# Patient Record
Sex: Female | Born: 1990 | Race: Black or African American | Hispanic: No | Marital: Single | Smoking: Never smoker
Health system: Southern US, Community
[De-identification: ages and names within clinical notes are randomized; demographics above are authoritative.]

## PROBLEM LIST (undated history)

## (undated) DIAGNOSIS — J45909 Unspecified asthma, uncomplicated: Secondary | ICD-10-CM

## (undated) DIAGNOSIS — N200 Calculus of kidney: Secondary | ICD-10-CM

---

## 2018-07-15 ENCOUNTER — Emergency Department (HOSPITAL_COMMUNITY)
Admission: EM | Admit: 2018-07-15 | Discharge: 2018-07-15 | Disposition: A | Discharge status: Home / Self Care | Payer: Self-pay | Attending: Emergency Medicine | Admitting: Emergency Medicine

## 2018-07-15 ENCOUNTER — Encounter (HOSPITAL_COMMUNITY): Payer: Self-pay

## 2018-07-15 ENCOUNTER — Other Ambulatory Visit: Payer: Self-pay

## 2018-07-15 ENCOUNTER — Emergency Department (HOSPITAL_COMMUNITY): Payer: Self-pay

## 2018-07-15 DIAGNOSIS — J069 Acute upper respiratory infection, unspecified: Secondary | ICD-10-CM | POA: Insufficient documentation

## 2018-07-15 LAB — I-STAT BETA HCG BLOOD, ED (MC, WL, AP ONLY): I-stat hCG, quantitative: <5 m[IU]/mL (ref <5)

## 2018-07-15 NOTE — ED Provider Notes (Signed)
[facility] Provider Note   CSN: [id number] Arrival date & time: 07/15/18  0846     History   Chief Complaint Chief Complaint  Patient presents with  . Shortness of Breath    HPI Nichole Richardson is a 28 y.o. female.  The history is provided by the patient.  Cough  Cough characteristics:  Non-productive Sputum characteristics:  Nondescript Severity:  Mild Onset quality:  Gradual Timing:  Intermittent Progression:  Waxing and waning Chronicity:  New Smoker: no   Relieved by:  Cough suppressants Worsened by:  Nothing Associated symptoms: chest pain and shortness of breath   Associated symptoms: no chills, no ear pain, no fever, no myalgias, no rash, no sinus congestion, no sore throat and no wheezing   Risk factors: no recent infection and no recent travel     History reviewed. No pertinent past medical history.  There are no active problems to display for this patient.     OB History   No obstetric history on file.      Home Medications    Prior to Admission medications   Medication Sig Start Date End Date Taking? Authorizing Provider  Pseudoephedrine-APAP-DM (DAYQUIL MULTI-SYMPTOM COLD/FLU PO) Take 2 tablets by mouth as needed (cold symptons).    Yes [provider]    Family History History reviewed. No pertinent family history.  Social History Social History   Tobacco Use  . Smoking status: Not on file  Substance Use Topics  . Alcohol use: Not on file  . Drug use: Not on file     Allergies   Patient has no known allergies.   Review of Systems Review of Systems  Constitutional: Negative for chills and fever.  HENT: Negative for ear pain and sore throat.   Eyes: Negative for pain and visual disturbance.  Respiratory: Positive for cough and shortness of breath. Negative for wheezing.   Cardiovascular: Positive for chest pain. Negative for palpitations.  Gastrointestinal: Negative for  abdominal pain and vomiting.  Genitourinary: Negative for dysuria and hematuria.  Musculoskeletal: Negative for arthralgias, back pain and myalgias.  Skin: Negative for color change and rash.  Neurological: Negative for seizures and syncope.  All other systems reviewed and are negative.    Physical Exam Updated Vital Signs  Vitals wnl  Physical Exam Vitals signs and nursing note reviewed.  Constitutional:      General: She is not in acute distress.    Appearance: She is well-developed.  HENT:     Head: Normocephalic and atraumatic.     Mouth/Throat:     Mouth: Mucous membranes are moist.     Pharynx: No pharyngeal swelling or oropharyngeal exudate.  Eyes:     Conjunctiva/sclera: Conjunctivae normal.     Pupils: Pupils are equal, round, and reactive to light.  Neck:     Musculoskeletal: Neck supple.  Cardiovascular:     Rate and Rhythm: Normal rate and regular rhythm.     Pulses: Normal pulses.     Heart sounds: Normal heart sounds. No murmur.  Pulmonary:     Effort: Pulmonary effort is normal. No respiratory distress.     Breath sounds: Normal breath sounds. No decreased breath sounds, wheezing or rhonchi.  Abdominal:     Palpations: Abdomen is soft.     Tenderness: There is no abdominal tenderness.  Musculoskeletal:     Right lower leg: No edema.     Left lower leg: No edema.  Skin:  General: Skin is warm and dry.     Capillary Refill: Capillary refill takes less than 2 seconds.  Neurological:     Mental Status: She is alert.  Psychiatric:        Mood and Affect: Mood normal.      ED Treatments / Results  Labs (all labs ordered are listed, but only abnormal results are displayed) Labs Reviewed  I-STAT BETA HCG BLOOD, ED ([facility], [facility], [facility] ONLY)    EKG EKG Interpretation  Date/Time:  Sunday July 15 2018 08:56:54 EST Ventricular Rate:  88 PR Interval:    QRS Duration: 91 QT Interval:  355 QTC Calculation: 430 R Axis:   63 Text Interpretation:  Sinus  rhythm Confirmed by Virgina Norfolk [phone]) on 07/15/2018 9:07:27 AM   Radiology Dg Chest 2 View  Result Date: 07/15/2018 CLINICAL DATA:  Shortness of breath. EXAM: CHEST - 2 VIEW COMPARISON:  None. FINDINGS: The heart size and mediastinal contours are within normal limits. Both lungs are clear. No pneumothorax or pleural effusion is noted. The visualized skeletal structures are unremarkable. IMPRESSION: No active cardiopulmonary disease. Electronically Signed   By: Lupita Raider, M.D.   On: 07/15/2018 10:01    Procedures Procedures (including critical care time)  Medications Ordered in ED Medications - No data to display   Initial Impression / Assessment and Plan / ED Course  I have reviewed the triage vital signs and the nursing notes.  Pertinent labs & imaging results that were available during my care of the patient were reviewed by me and considered in my medical decision making (see chart for details).     Nichole Richardson is a 28 year old female with no significant medical history presents to the ED with cough, intermittent chest pain, shortness of breath.  Patient with unremarkable vitals.  Patient with sick contact with the flu.  She has had dry cough.  When she coughs she feels some chest pain and shortness of breath.  However asymptomatic when she is not coughing.  Has tried some over-the-counter medications with some relief.  Did not get a flu shot this year.  Denies any body aches, fever.  Patient does not have any DVT or PE risk factors and doubt PE.  No significant cardiac history in the family.  No cardiac risk factors.  EKG shows sinus rhythm.  No ischemic changes.  Doubt cardiac process.  Chest x-ray showed no signs of pneumonia, pneumothorax, pleural effusion.  Pregnancy test was negative.  Overall suspect mild viral process.  Discharged from the ED in good condition and given return precautions. Given PCP follow up.  This chart was dictated using voice recognition software.   Despite best efforts to proofread,  errors can occur which can change the documentation meaning.  Final Clinical Impressions(s) / ED Diagnoses   Final diagnoses:  Upper respiratory tract infection, unspecified type    ED Discharge Orders    None       Virgina Norfolk, DO 07/15/18 1009

## 2018-07-15 NOTE — ED Triage Notes (Signed)
Pt with c/o SOB x 2 days; pt states that its getting worse and she has discomfort in chest. Pt states that immediately family members have had flu recently

## 2018-07-15 NOTE — Discharge Instructions (Addendum)
There was no sign of pneumonia on your chest x-ray today.  Continue hydration at home.  Tylenol, Motrin.  Your pregnancy test was negative.

## 2018-10-23 ENCOUNTER — Emergency Department (HOSPITAL_COMMUNITY): Payer: Medicaid - Out of State

## 2018-10-23 ENCOUNTER — Other Ambulatory Visit: Payer: Self-pay

## 2018-10-23 ENCOUNTER — Emergency Department (HOSPITAL_COMMUNITY)
Admission: EM | Admit: 2018-10-23 | Discharge: 2018-10-23 | Disposition: A | Discharge status: Home / Self Care | Payer: Medicaid - Out of State | Attending: Emergency Medicine | Admitting: Emergency Medicine

## 2018-10-23 ENCOUNTER — Encounter (HOSPITAL_COMMUNITY): Payer: Self-pay | Admitting: Emergency Medicine

## 2018-10-23 DIAGNOSIS — R062 Wheezing: Secondary | ICD-10-CM | POA: Insufficient documentation

## 2018-10-23 DIAGNOSIS — R0602 Shortness of breath: Secondary | ICD-10-CM | POA: Diagnosis present

## 2018-10-23 DIAGNOSIS — J9801 Acute bronchospasm: Secondary | ICD-10-CM | POA: Insufficient documentation

## 2018-10-23 HISTORY — DX: Calculus of kidney: N20.0

## 2018-10-23 LAB — BASIC METABOLIC PANEL
Anion gap: 9 (ref 5–15)
BUN: 10 mg/dL (ref 6–20)
CO2: 22 mmol/L (ref 22–32)
Calcium: 8.9 mg/dL (ref 8.9–10.3)
Chloride: 108 mmol/L (ref 98–111)
Creatinine, Ser: 1.09 mg/dL — ABNORMAL HIGH (ref 0.44–1.00)
GFR calc Af Amer: >60 mL/min (ref >60)
GFR calc non Af Amer: >60 mL/min (ref >60)
Glucose, Bld: 92 mg/dL (ref 70–99)
Potassium: 3.6 mmol/L (ref 3.5–5.1)
Sodium: 139 mmol/L (ref 135–145)

## 2018-10-23 LAB — CBC WITH DIFFERENTIAL/PLATELET
Abs Immature Granulocytes: 0.02 10*3/uL (ref 0.00–0.07)
Basophils Absolute: 0 10*3/uL (ref 0.0–0.1)
Basophils Relative: 0 %
Eosinophils Absolute: 0.6 10*3/uL — ABNORMAL HIGH (ref 0.0–0.5)
Eosinophils Relative: 7 %
HCT: 40.7 % (ref 36.0–46.0)
Hemoglobin: 12.6 g/dL (ref 12.0–15.0)
Immature Granulocytes: 0 %
Lymphocytes Relative: 41 %
Lymphs Abs: 3.5 10*3/uL (ref 0.7–4.0)
MCH: 26.3 pg (ref 26.0–34.0)
MCHC: 31 g/dL (ref 30.0–36.0)
MCV: 85 fL (ref 80.0–100.0)
Monocytes Absolute: 0.5 10*3/uL (ref 0.1–1.0)
Monocytes Relative: 6 %
Neutro Abs: 3.9 10*3/uL (ref 1.7–7.7)
Neutrophils Relative %: 46 %
Platelets: 237 10*3/uL (ref 150–400)
RBC: 4.79 MIL/uL (ref 3.87–5.11)
RDW: 12.4 % (ref 11.5–15.5)
WBC: 8.6 10*3/uL (ref 4.0–10.5)
nRBC: 0 % (ref 0.0–0.2)

## 2018-10-23 MED ORDER — ALBUTEROL SULFATE HFA 108 (90 BASE) MCG/ACT IN AERS
2.0000 | INHALATION_SPRAY | Freq: Once | RESPIRATORY_TRACT | Status: AC
  Administered 2018-10-23: 04:00:00 2 via RESPIRATORY_TRACT
  Filled 2018-10-23: qty 6.7

## 2018-10-23 MED ORDER — METHYLPREDNISOLONE SODIUM SUCC 125 MG IJ SOLR
125.0000 mg | Freq: Once | INTRAMUSCULAR | Status: AC
  Administered 2018-10-23: 03:00:00 125 mg via INTRAVENOUS
  Filled 2018-10-23: qty 2

## 2018-10-23 MED ORDER — PREDNISONE 20 MG PO TABS: ORAL_TABLET | ORAL | 0 refills | Status: DC

## 2018-10-23 MED ORDER — IPRATROPIUM BROMIDE 0.02 % IN SOLN
0.5000 mg | Freq: Once | RESPIRATORY_TRACT | Status: AC
  Administered 2018-10-23: 0.5 mg via RESPIRATORY_TRACT
  Filled 2018-10-23: qty 2.5

## 2018-10-23 MED ORDER — ALBUTEROL SULFATE (2.5 MG/3ML) 0.083% IN NEBU
5.0000 mg | INHALATION_SOLUTION | Freq: Once | RESPIRATORY_TRACT | Status: AC
  Administered 2018-10-23: 03:00:00 5 mg via RESPIRATORY_TRACT
  Filled 2018-10-23: qty 6

## 2018-10-23 MED ORDER — ALBUTEROL SULFATE (2.5 MG/3ML) 0.083% IN NEBU
5.0000 mg | INHALATION_SOLUTION | Freq: Once | RESPIRATORY_TRACT | Status: AC
  Administered 2018-10-23: 5 mg via RESPIRATORY_TRACT
  Filled 2018-10-23: qty 6

## 2018-10-23 MED ORDER — MAGNESIUM SULFATE 2 GM/50ML IV SOLN
2.0000 g | Freq: Once | INTRAVENOUS | Status: AC
  Administered 2018-10-23: 2 g via INTRAVENOUS
  Filled 2018-10-23: qty 50

## 2018-10-23 NOTE — ED Notes (Signed)
Patient verbalizes understanding of discharge instructions. Opportunity for questioning and answers were provided. Armband removed by staff, pt discharged from ED ambulatory.   

## 2018-10-23 NOTE — ED Provider Notes (Signed)
[facility] EMERGENCY DEPARTMENT Provider Note   CSN: [id number] Arrival date & time: 10/23/18  4098    History   Chief Complaint Chief Complaint  Patient presents with  . Shortness of Breath    HPI Nichole Richardson is a 28 y.o. female.     The history is provided by the patient and medical records.  Shortness of Breath  Associated symptoms: cough and wheezing      28 y.o. F with history of seasonal and pet allergies, presenting to the ED for SOB.  States she has been sneezing and having itchy eyes lately but has been taking claritin 24H with moderate control.  States tonight about an hour ago symptoms got acutely worse.  She has been wheezing with chest tightness.  She has no history of asthma.  She does have a dog at home which she has had for a few months but felt like it was ok since she was taking allergy medication.  Also reports this is her first allergy season in [place] as she recently moved here from [place], [place].  No fevers, chills, cough, or COVID exposures.  States she has been staying home aside from grocery shopping when absolutely necessary.  No past medical history on file.  There are no active problems to display for this patient.    OB History   No obstetric history on file.      Home Medications    Prior to Admission medications   Medication Sig Start Date End Date Taking? Authorizing Provider  Pseudoephedrine-APAP-DM (DAYQUIL MULTI-SYMPTOM COLD/FLU PO) Take 2 tablets by mouth as needed (cold symptons).     [provider]    Family History No family history on file.  Social History Social History   Tobacco Use  . Smoking status: Not on file  Substance Use Topics  . Alcohol use: Not on file  . Drug use: Not on file     Allergies   Patient has no known allergies.   Review of Systems Review of Systems  Respiratory: Positive for cough, shortness of breath and wheezing.   All other systems reviewed and are  negative.    Physical Exam Updated Vital Signs Temp 98.3 F (36.8 C) (Oral)   Ht  (1.6 m)   Wt 59 kg   BMI 23.03 kg/m   Physical Exam Vitals signs and nursing note reviewed.  Constitutional:      Appearance: She is well-developed.  HENT:     Head: Normocephalic and atraumatic.  Eyes:     Conjunctiva/sclera: Conjunctivae normal.     Pupils: Pupils are equal, round, and reactive to light.     Comments: Slight puffiness around the eyes  Neck:     Musculoskeletal: Normal range of motion.  Cardiovascular:     Rate and Rhythm: Normal rate and regular rhythm.     Heart sounds: Normal heart sounds.  Pulmonary:     Effort: Pulmonary effort is normal.     Breath sounds: Wheezing present. No rhonchi.     Comments: Audible wheezing from bedside, speaking in short truncated sentences; accessory muscle use noted, O2 sats 95% Abdominal:     General: Bowel sounds are normal.     Palpations: Abdomen is soft.  Musculoskeletal: Normal range of motion.  Skin:    General: Skin is warm and dry.  Neurological:     Mental Status: She is alert and oriented to person, place, and time.      ED  Treatments / Results  Labs (all labs ordered are listed, but only abnormal results are displayed) Labs Reviewed  CBC WITH DIFFERENTIAL/PLATELET - Abnormal; Notable for the following components:      Result Value   Eosinophils Absolute 0.6 (*)    All other components within normal limits  BASIC METABOLIC PANEL - Abnormal; Notable for the following components:   Creatinine, Ser 1.09 (*)    All other components within normal limits    EKG EKG Interpretation  Date/Time:  Tuesday October 23 2018 02:38:23 EDT Ventricular Rate:  105 PR Interval:    QRS Duration: 94 QT Interval:  293 QTC Calculation: 388 R Axis:   82 Text Interpretation:  Sinus tachycardia Borderline T abnormalities, diffuse leads Artifact in lead(s) II III aVR aVF Otherwise no significant change Confirmed by Drema Pry  [phone]) on 10/23/2018 3:26:31 AM   Radiology Dg Chest Port 1 View  Result Date: 10/23/2018 CLINICAL DATA:  Wheezing, shortness of breath EXAM: PORTABLE CHEST 1 VIEW COMPARISON:  07/15/2018 FINDINGS: Heart and mediastinal contours are within normal limits. No focal opacities or effusions. No acute bony abnormality. IMPRESSION: No active disease. Electronically Signed   By: Charlett Nose M.D.   On: 10/23/2018 02:51    Procedures Procedures (including critical care time)  Medications Ordered in ED Medications  albuterol (PROVENTIL) (2.5 MG/3ML) 0.083% nebulizer solution 5 mg (5 mg Nebulization Given 10/23/18 0240)  ipratropium (ATROVENT) nebulizer solution 0.5 mg (0.5 mg Nebulization Given 10/23/18 0240)  magnesium sulfate IVPB 2 g 50 mL (0 g Intravenous Stopped 10/23/18 0345)  methylPREDNISolone sodium succinate (SOLU-MEDROL) 125 mg/2 mL injection 125 mg (125 mg Intravenous Given 10/23/18 0242)  albuterol (PROVENTIL) (2.5 MG/3ML) 0.083% nebulizer solution 5 mg (5 mg Nebulization Given 10/23/18 0345)  albuterol (VENTOLIN HFA) 108 (90 Base) MCG/ACT inhaler 2 puff (2 puffs Inhalation Given 10/23/18 0345)     Initial Impression / Assessment and Plan / ED Course  I have reviewed the triage vital signs and the nursing notes.  Pertinent labs & imaging results that were available during my care of the patient were reviewed by me and considered in my medical decision making (see chart for details).  28 y.o. F here with SOB.  Has bad seasonal allergies, also has dog with known pet allergies. No history of asthma. She is afebrile, non-toxic but does appear somewhat labored with accessory muscle use.  Wheezing audible from bedside.  She is able to speak in short sentences.  No fevers, chills, cough, or other URI symptoms.  No sick contacts or known COVID exposures.  Plan for screening labs, EKG, CXR.  Given albuterol treatment, solu-medrol, magnesium.    3:15 AM After initial meds, patient states she feels  better.  She is now talking on phone with significant other.  O2 sats up to 100% now.  She still has some expiratory wheezes.  Labs reassuring.  CXR clear.  Will give additional neb and can likely discharge with symptomatic care.  Rx prednisone, continue albuterol PRN.  Can use PRN benadryl as needed, continue allergy medications.  Follow-up as an OP.  Return here for any new/acute changes.  Final Clinical Impressions(s) / ED Diagnoses   Final diagnoses:  Bronchospasm  Wheezing    ED Discharge Orders         Ordered    predniSONE (DELTASONE) 20 MG tablet     10/23/18 0346           Garlon Hatchet, PA-C 10/23/18 [phone]  Nira Connardama, Pedro Eduardo, MD 10/23/18 [phone]

## 2018-10-23 NOTE — Discharge Instructions (Signed)
Take the prescribed medication as directed.  Continue your allergy medication.  You can add benadryl if needed every 6-8 hours. Follow-up with your primary care doctor. Return to the ED for new or worsening symptoms.

## 2018-10-23 NOTE — ED Triage Notes (Signed)
C/O of SOB starting this evening. Pt very wheezy on arrival weith increased work of breathing noted. No hx of Asthma. Reports allergy to dogs and pt just got a new a few months ago.

## 2018-11-30 ENCOUNTER — Other Ambulatory Visit: Payer: Self-pay

## 2018-11-30 ENCOUNTER — Emergency Department (HOSPITAL_COMMUNITY)
Admission: EM | Admit: 2018-11-30 | Discharge: 2018-11-30 | Disposition: A | Discharge status: Home / Self Care | Payer: Medicaid - Out of State | Attending: Emergency Medicine | Admitting: Emergency Medicine

## 2018-11-30 ENCOUNTER — Encounter (HOSPITAL_COMMUNITY): Payer: Self-pay | Admitting: Emergency Medicine

## 2018-11-30 DIAGNOSIS — F17210 Nicotine dependence, cigarettes, uncomplicated: Secondary | ICD-10-CM | POA: Insufficient documentation

## 2018-11-30 DIAGNOSIS — R05 Cough: Secondary | ICD-10-CM | POA: Insufficient documentation

## 2018-11-30 DIAGNOSIS — F1290 Cannabis use, unspecified, uncomplicated: Secondary | ICD-10-CM | POA: Insufficient documentation

## 2018-11-30 DIAGNOSIS — R0602 Shortness of breath: Secondary | ICD-10-CM | POA: Diagnosis present

## 2018-11-30 DIAGNOSIS — R062 Wheezing: Secondary | ICD-10-CM | POA: Diagnosis not present

## 2018-11-30 DIAGNOSIS — R1110 Vomiting, unspecified: Secondary | ICD-10-CM | POA: Diagnosis not present

## 2018-11-30 MED ORDER — ACETAMINOPHEN 325 MG PO TABS
650.0000 mg | ORAL_TABLET | Freq: Once | ORAL | Status: AC
  Administered 2018-11-30: 650 mg via ORAL
  Filled 2018-11-30: qty 2

## 2018-11-30 MED ORDER — ALBUTEROL SULFATE HFA 108 (90 BASE) MCG/ACT IN AERS
4.0000 | INHALATION_SPRAY | Freq: Once | RESPIRATORY_TRACT | Status: AC
  Administered 2018-11-30: 4 via RESPIRATORY_TRACT
  Filled 2018-11-30: qty 6.7

## 2018-11-30 MED ORDER — PREDNISONE 20 MG PO TABS
60.0000 mg | ORAL_TABLET | Freq: Once | ORAL | Status: AC
  Administered 2018-11-30: 60 mg via ORAL
  Filled 2018-11-30: qty 3

## 2018-11-30 MED ORDER — PREDNISONE 10 MG PO TABS: 40.0000 mg | ORAL_TABLET | Freq: Every day | ORAL | 0 refills | Status: AC

## 2018-11-30 NOTE — ED Triage Notes (Signed)
Pt. Stated, I started being  SOB a moth ago, given an inhaler and now Im out and Im still having SOB .

## 2018-11-30 NOTE — Discharge Instructions (Addendum)
Take prednisone as prescribed beginning tomorrow.  You received the first dose in the emergency department today.  You can use the good Rx app to help make medications more affordable.  Use the albuterol inhaler 1 to 2 puffs every 4-6 hours as needed for shortness of breath.  You can take over-the-counter allergy medicine such as Zyrtec or Allegra for your symptoms as well.  He can also use Robitussin, Mucinex, or other over-the-counter cough and cold medicines.  Follow-up with a primary care physician for reevaluation of your symptoms.  I have attached resources for follow-up in the area.  I have also given you the information for  and wellness and primary care at [facility].  Call them tomorrow and tell them that you were referred from the emergency department.   Return to the emergency department if any concerning signs or symptoms develop such as worsening shortness of breath, persistent chest pains, leg swelling, high fevers, persistent vomiting, or passing out.

## 2018-11-30 NOTE — ED Provider Notes (Addendum)
[facility] EMERGENCY DEPARTMENT Provider Note   CSN: [id number] Arrival date & time: 11/30/18  1640    History   Chief Complaint Chief Complaint  Patient presents with  . Shortness of Breath    HPI Nichole Richardson is a 28 y.o. female with history of seasonal allergies presents for evaluation of acute onset, waxing and waning shortness of breath, cough, and wheezing for over 1 month.  She was seen and evaluated for the symptoms on 10/23/2018 and discharged with a course of prednisone and an albuterol inhaler.  She reports that she was unable to fill the prednisone prescription due to financial constraints.  She does report that the albuterol was significantly helpful but she ran out of it a few days ago.  She states that she was using it twice daily.  She continues to take her allergy medicines as well with some improvement.  She notes dry cough, with wheezes, chest tightness and soreness.  Denies fever, leg swelling, recent travel or surgeries, hemoptysis, prior history of DVT or PE, or hormone replacement therapy.  She has had some posttussive emesis there is resulting from the persistent cough.  Also used to take Mucinex with improvement in her symptoms but stopped taking this recently.  She is requesting a refill of albuterol inhaler.  She smokes marijuana occasionally, denies cigarette smoking or recreational drug use otherwise.  No known sick contacts.     The history is provided by the patient.    Past Medical History:  Diagnosis Date  . Renal stones     There are no active problems to display for this patient.   History reviewed. No pertinent surgical history.   OB History   No obstetric history on file.      Home Medications    Prior to Admission medications   Medication Sig Start Date End Date Taking? Authorizing Provider  predniSONE (DELTASONE) 10 MG tablet Take 4 tablets (40 mg total) by mouth daily for 5 days. 11/30/18 12/05/18  Aniqua Briere A, PA-C   Pseudoephedrine-APAP-DM (DAYQUIL MULTI-SYMPTOM COLD/FLU PO) Take 2 tablets by mouth as needed (cold symptons).     [provider]    Family History No family history on file.  Social History Social History   Tobacco Use  . Smoking status: Current Every Day Smoker  . Smokeless tobacco: Current User  Substance Use Topics  . Alcohol use: Yes    Comment: occas  . Drug use: Yes    Types: Marijuana     Allergies   Patient has no known allergies.   Review of Systems Review of Systems  Constitutional: Negative for chills and fever.  Respiratory: Positive for cough, chest tightness and shortness of breath.   Cardiovascular: Negative for chest pain and leg swelling.  Gastrointestinal: Positive for vomiting (post-tussive). Negative for abdominal pain.  All other systems reviewed and are negative.    Physical Exam Updated Vital Signs BP (!) 110/95 (BP Location: Right Arm)   Pulse 85   Temp (!) 97.4 F (36.3 C) (Oral)   Resp 16   Ht  (1.6 m)   Wt 63.5 kg   LMP 10/08/2018   SpO2 99%   BMI 24.80 kg/m   Physical Exam Vitals signs and nursing note reviewed.  Constitutional:      General: She is not in acute distress.    Appearance: She is well-developed.     Comments: Resting comfortably in chair  HENT:     Head:  Normocephalic and atraumatic.  Eyes:     General:        Right eye: No discharge.        Left eye: No discharge.     Conjunctiva/sclera: Conjunctivae normal.  Neck:     Vascular: No JVD.     Trachea: No tracheal deviation.  Cardiovascular:     Rate and Rhythm: Normal rate and regular rhythm.     Comments: Homans sign absent bilaterally Pulmonary:     Effort: Pulmonary effort is normal.     Comments: Speaking in full sentences without difficulty, though she does have audible wheezes.  On auscultation of the lungs diffuse expiratory wheezes are noted.  SPO2 saturations 98% on room air. Abdominal:     General: Bowel sounds are normal. There  is no distension.     Palpations: Abdomen is soft. There is no mass.     Tenderness: There is no abdominal tenderness. There is no guarding.  Musculoskeletal:     Right lower leg: She exhibits no tenderness. No edema.     Left lower leg: She exhibits no tenderness. No edema.  Skin:    General: Skin is warm and dry.     Findings: No erythema.  Neurological:     Mental Status: She is alert.  Psychiatric:        Behavior: Behavior normal.      ED Treatments / Results  Labs (all labs ordered are listed, but only abnormal results are displayed) Labs Reviewed - No data to display  EKG None  Radiology No results found.  Procedures Procedures (including critical care time)  Medications Ordered in ED Medications  albuterol (VENTOLIN HFA) 108 (90 Base) MCG/ACT inhaler 4 puff (4 puffs Inhalation Given 11/30/18 1826)  predniSONE (DELTASONE) tablet 60 mg (60 mg Oral Given 11/30/18 1826)  acetaminophen (TYLENOL) tablet 650 mg (650 mg Oral Given 11/30/18 1826)     Initial Impression / Assessment and Plan / ED Course  I have reviewed the triage vital signs and the nursing notes.  Pertinent labs & imaging results that were available during my care of the patient were reviewed by me and considered in my medical decision making (see chart for details).        Patient with recurrence of shortness of breath, cough, wheezing.  She is afebrile, initially mildly tachycardic with complete resolution on subsequent reevaluation's. She has obvious wheezing on examination but is speaking in full sentences with stable SPO2 saturations.  No fever.  Abdomen is soft and nontender, doubt acute surgical abdominal pathology.  She does not wish to have a chest x-ray today and does not think that she has pneumonia.  She states "I just want a breathing treatment and an albuterol inhaler refill".  I explained to her that given the COVID-19 pandemic, we are not administering breathing treatments due to  aerosolization concerns.  She was given a few puffs of an albuterol inhaler in the ED with improvement in her wheezing and shortness of breath.  Also given prednisone.  Will discharge with a course of prednisone and given financial assistance so that she can better afford the medication.  I doubt pneumonia given no focal adventitious breath sounds, no hypoxia, no fever.  Doubt PE.  I do recommend follow-up with a PCP for reevaluation of her shortness of breath and wheezing.  Discussed strict ED return precautions. Pt verbalized understanding of and agreement with plan and is safe for discharge home at this time.  Final Clinical  Impressions(s) / ED Diagnoses   Final diagnoses:  Wheezing    ED Discharge Orders         Ordered    predniSONE (DELTASONE) 10 MG tablet  Daily     11/30/18 1759             Jeanie SewerFawze, Trennon Torbeck A, PA-C 11/30/18 2041    Cathren LaineSteinl, Kevin, MD 12/01/18 1521

## 2019-01-11 ENCOUNTER — Emergency Department (HOSPITAL_COMMUNITY)
Admission: EM | Admit: 2019-01-11 | Discharge: 2019-01-11 | Discharge status: Absent without leave | Payer: Medicaid - Out of State

## 2019-01-11 ENCOUNTER — Other Ambulatory Visit: Payer: Self-pay

## 2019-01-11 NOTE — ED Notes (Signed)
Pt called for triage x3 first attempt x3 2nd attempt no response both times.

## 2019-01-12 ENCOUNTER — Other Ambulatory Visit: Payer: Self-pay

## 2019-01-12 ENCOUNTER — Encounter (HOSPITAL_COMMUNITY): Payer: Self-pay | Admitting: Emergency Medicine

## 2019-01-12 ENCOUNTER — Emergency Department (HOSPITAL_COMMUNITY)
Admission: EM | Admit: 2019-01-12 | Discharge: 2019-01-12 | Disposition: A | Discharge status: Home / Self Care | Payer: Medicaid - Out of State | Attending: Emergency Medicine | Admitting: Emergency Medicine

## 2019-01-12 DIAGNOSIS — J4520 Mild intermittent asthma, uncomplicated: Secondary | ICD-10-CM | POA: Insufficient documentation

## 2019-01-12 DIAGNOSIS — F17290 Nicotine dependence, other tobacco product, uncomplicated: Secondary | ICD-10-CM | POA: Insufficient documentation

## 2019-01-12 DIAGNOSIS — R062 Wheezing: Secondary | ICD-10-CM | POA: Diagnosis present

## 2019-01-12 MED ORDER — ALBUTEROL SULFATE HFA 108 (90 BASE) MCG/ACT IN AERS: 1.0000 | INHALATION_SPRAY | Freq: Four times a day (QID) | RESPIRATORY_TRACT | 0 refills | Status: DC | PRN

## 2019-01-12 MED ORDER — AEROCHAMBER PLUS FLO-VU LARGE MISC
Status: AC
  Filled 2019-01-12: qty 1

## 2019-01-12 MED ORDER — ALBUTEROL SULFATE HFA 108 (90 BASE) MCG/ACT IN AERS
2.0000 | INHALATION_SPRAY | RESPIRATORY_TRACT | Status: DC | PRN
  Administered 2019-01-12: 2 via RESPIRATORY_TRACT
  Filled 2019-01-12: qty 6.7

## 2019-01-12 MED ORDER — AEROCHAMBER PLUS FLO-VU MEDIUM MISC
1.0000 | Freq: Once | Status: AC
  Administered 2019-01-12: 1

## 2019-01-12 NOTE — ED Provider Notes (Signed)
[facility] EMERGENCY DEPARTMENT Provider Note   CSN: [id number] Arrival date & time: 01/12/19  1133     History   Chief Complaint Chief Complaint  Patient presents with  . Asthma    HPI Nichole Richardson is a 28 y.o. female.     HPI 28 year old female with history of seasonal allergies, asthma presents today stating that she is out of albuterol and is having some wheezing.  She states that she moved here from [place] 7 months ago.  She began having seasonal allergies with some wheezing.  She has been to the ED and received albuterol with good relief.  She has been out of her albuterol and has had some increased wheezing over the past several days.  She denies productive cough, fever, chills, nausea, vomiting, pregnancy, history of DVT, or PE.  She has been taking cetirizine over-the-counter but has not been taking for the past several days. Denies possibility of pregnancy states she is not sexually active with men Past Medical History:  Diagnosis Date  . Renal stones     There are no active problems to display for this patient.   History reviewed. No pertinent surgical history.   OB History   No obstetric history on file.      Home Medications    Prior to Admission medications   Medication Sig Start Date End Date Taking? Authorizing Provider  albuterol (VENTOLIN HFA) 108 (90 Base) MCG/ACT inhaler Inhale 1-2 puffs into the lungs every 6 (six) hours as needed for wheezing or shortness of breath. 01/12/19   Pattricia Boss, MD  Pseudoephedrine-APAP-DM (DAYQUIL MULTI-SYMPTOM COLD/FLU PO) Take 2 tablets by mouth as needed (cold symptons).     [provider]    Family History No family history on file.  Social History Social History   Tobacco Use  . Smoking status: Current Every Day Smoker  . Smokeless tobacco: Current User  Substance Use Topics  . Alcohol use: Yes    Comment: occas  . Drug use: Yes    Types: Marijuana     Allergies    Patient has no known allergies.   Review of Systems Review of Systems  All other systems reviewed and are negative.    Physical Exam Updated Vital Signs BP 100/84 (BP Location: Right Arm)   Pulse 86   Temp 98.3 F (36.8 C) (Oral)   Resp 14   Ht 1.575 m (5\' 2" )   Wt 65.8 kg   LMP  (Within Weeks)   SpO2 99%   BMI 26.52 kg/m   Physical Exam Vitals signs and nursing note reviewed.  Constitutional:      General: She is not in acute distress.    Appearance: Normal appearance. She is normal weight.  HENT:     Head: Normocephalic and atraumatic.     Right Ear: External ear normal.     Left Ear: External ear normal.     Nose: Nose normal.     Mouth/Throat:     Mouth: Mucous membranes are moist.  Eyes:     Pupils: Pupils are equal, round, and reactive to light.  Neck:     Musculoskeletal: Normal range of motion.  Cardiovascular:     Rate and Rhythm: Normal rate and regular rhythm.     Pulses: Normal pulses.     Heart sounds: Normal heart sounds.  Pulmonary:     Effort: Pulmonary effort is normal.     Breath sounds: Wheezing present.  Comments: Patient with few scattered rhonchi and diffuse wheezes greater on the left than right Abdominal:     General: Abdomen is flat.     Palpations: Abdomen is soft.  Musculoskeletal: Normal range of motion.  Skin:    General: Skin is warm and dry.     Capillary Refill: Capillary refill takes less than 2 seconds.  Neurological:     General: No focal deficit present.     Mental Status: She is alert.     Cranial Nerves: No cranial nerve deficit.     Sensory: No sensory deficit.     Motor: No weakness.     Coordination: Coordination normal.     Gait: Gait normal.     Deep Tendon Reflexes: Reflexes normal.  Psychiatric:        Mood and Affect: Mood normal.        Behavior: Behavior normal.      ED Treatments / Results  Labs (all labs ordered are listed, but only abnormal results are displayed) Labs Reviewed - No data to  display  EKG None  Radiology No results found.  Procedures Procedures (including critical care time)  Medications Ordered in ED Medications  albuterol (VENTOLIN HFA) 108 (90 Base) MCG/ACT inhaler 2 puff (has no administration in time range)  AeroChamber Plus Flo-Vu Medium MISC 1 each (has no administration in time range)     Initial Impression / Assessment and Plan / ED Course  I have reviewed the triage vital signs and the nursing notes.  Pertinent labs & imaging results that were available during my care of the patient were reviewed by me and considered in my medical decision making (see chart for details).       28 yo female with asthma presents with cough and wheezing.  Albuterol hfa given.  Discussed smoking cessation, otc medications, and need for follow up. Patient voices understanding. Final Clinical Impressions(s) / ED Diagnoses   Final diagnoses:  Mild intermittent asthma, unspecified whether complicated    ED Discharge Orders         Ordered    albuterol (VENTOLIN HFA) 108 (90 Base) MCG/ACT inhaler  Every 6 hours PRN     01/12/19 1220           Margarita Grizzleay, Donta Fuster, MD 01/12/19 1226

## 2019-01-12 NOTE — ED Triage Notes (Signed)
Patient from home with complaint of shortness of breath with history of asthma. Patient moved to [place] in January from [place] and states she has been having difficulty coping with allergy issues. Reports no primary care doctor, unable to obtain refills for medications. Was recently prescribed an albuterol inhaler and prednisone, and symptoms had resolved but after running out of inhaler symptoms have returned. SpO2 on room air 98%. Patient requesting refill of albuterol, patient does not have PCP nor insurance.

## 2019-03-23 ENCOUNTER — Emergency Department (HOSPITAL_COMMUNITY)
Admission: EM | Admit: 2019-03-23 | Discharge: 2019-03-23 | Discharge status: Against Medical Advice | Payer: Medicaid Other | Attending: Emergency Medicine | Admitting: Emergency Medicine

## 2019-03-23 ENCOUNTER — Encounter (HOSPITAL_COMMUNITY): Payer: Self-pay

## 2019-03-23 ENCOUNTER — Other Ambulatory Visit: Payer: Self-pay

## 2019-03-23 ENCOUNTER — Emergency Department (HOSPITAL_COMMUNITY)
Admission: EM | Admit: 2019-03-23 | Discharge: 2019-03-23 | Discharge status: Against Medical Advice | Payer: Medicaid - Out of State

## 2019-03-23 DIAGNOSIS — F17200 Nicotine dependence, unspecified, uncomplicated: Secondary | ICD-10-CM | POA: Insufficient documentation

## 2019-03-23 DIAGNOSIS — R062 Wheezing: Secondary | ICD-10-CM | POA: Diagnosis not present

## 2019-03-23 DIAGNOSIS — R9431 Abnormal electrocardiogram [ECG] [EKG]: Secondary | ICD-10-CM | POA: Diagnosis not present

## 2019-03-23 DIAGNOSIS — Z5320 Procedure and treatment not carried out because of patient's decision for unspecified reasons: Secondary | ICD-10-CM | POA: Diagnosis not present

## 2019-03-23 DIAGNOSIS — F17220 Nicotine dependence, chewing tobacco, uncomplicated: Secondary | ICD-10-CM | POA: Insufficient documentation

## 2019-03-23 MED ORDER — ALBUTEROL SULFATE HFA 108 (90 BASE) MCG/ACT IN AERS
4.0000 | INHALATION_SPRAY | Freq: Once | RESPIRATORY_TRACT | Status: AC
  Administered 2019-03-23: 12:00:00 4 via RESPIRATORY_TRACT
  Filled 2019-03-23: qty 6.7

## 2019-03-23 MED ORDER — AEROCHAMBER PLUS FLO-VU LARGE MISC
1.0000 | Freq: Once | Status: AC
  Administered 2019-03-23: 12:00:00 1
  Filled 2019-03-23 (×2): qty 1

## 2019-03-23 NOTE — ED Triage Notes (Signed)
Patient complains of ongoing cough and wheezing. Seen for same in ED, out of meds

## 2019-03-23 NOTE — ED Provider Notes (Signed)
[facility] EMERGENCY [facility]PARTMENT Provider Note   CSN: [id number] Arrival date & time: 03/23/19  [id number]     History   Chief Complaint Chief Complaint  Patient presents with  . Wheezing    HPI Nichole Richardson is a 28 y.o. female with a hx of tobacco abuse and asthma who presents to the emergency department with complaints of wheezing and trouble breathing that began last night.  Patient states that she moved to the area at the beginning of this year that she has had frequent issues with wheezing/shortness of breath.  She states she is out of her inhaler and is here because she needs a new one.  No alleviating or aggravating factors to her symptoms.  Denies fever, chills, productive cough, chest pain unilateral leg pain/swelling, hemoptysis, recent surgery/trauma, recent long travel, hormone use, personal hx of cancer, or hx of DVT/PE.   HPI  Past Medical History:  Diagnosis Date  . Renal stones     There are no active problems to display for this patient.   History reviewed. No pertinent surgical history.   OB History   No obstetric history on file.      Home Medications    Prior to Admission medications   Medication Sig Start Date End Date Taking? Authorizing Provider  albuterol (VENTOLIN HFA) 108 (90 Base) MCG/ACT inhaler Inhale 1-2 puffs into the lungs every 6 (six) hours as needed for wheezing or shortness of breath. 01/12/19   Margarita Grizzleay, Danielle, MD  Pseudoephedrine-APAP-DM (DAYQUIL MULTI-SYMPTOM COLD/FLU PO) Take 2 tablets by mouth as needed (cold symptons).     [provider]    Family History No family history on file.  Social History Social History   Tobacco Use  . Smoking status: Current Every Day Smoker  . Smokeless tobacco: Current User  Substance Use Topics  . Alcohol use: Yes    Comment: occas  . Drug use: Yes    Types: Marijuana     Allergies   Patient has no known allergies.   Review of Systems Review of Systems   Constitutional: Negative for chills and fever.  HENT: Negative for congestion, ear pain and sore throat.   Respiratory: Positive for shortness of breath and wheezing. Negative for cough.   Cardiovascular: Negative for chest pain.  Gastrointestinal: Negative for abdominal pain, diarrhea, nausea and vomiting.     Physical Exam Updated Vital Signs BP 116/85 (BP Location: Right Arm)   Pulse 84   Temp 97.6 F (36.4 C) (Oral)   Resp 20   SpO2 100%   Physical Exam Vitals signs and nursing note reviewed.  Constitutional:      General: She is not in acute distress.    Appearance: She is well-developed.  HENT:     Head: Normocephalic and atraumatic.     Right Ear: Ear canal normal. Tympanic membrane is not perforated, erythematous, retracted or bulging.     Left Ear: Ear canal normal. Tympanic membrane is not perforated, erythematous, retracted or bulging.     Ears:     Comments: No mastoid erythema/swelling/tenderness.     Nose:     Right Sinus: No maxillary sinus tenderness or frontal sinus tenderness.     Left Sinus: No maxillary sinus tenderness or frontal sinus tenderness.     Mouth/Throat:     Pharynx: Uvula midline. No oropharyngeal exudate or posterior oropharyngeal erythema.     Comments: Posterior oropharynx is symmetric appearing. Patient tolerating own secretions without difficulty. No trismus.  No drooling. No hot potato voice. No swelling beneath the tongue, submandibular compartment is soft.  Eyes:     General:        Right eye: No discharge.        Left eye: No discharge.     Conjunctiva/sclera: Conjunctivae normal.     Pupils: Pupils are equal, round, and reactive to light.  Neck:     Musculoskeletal: Normal range of motion and neck supple. No edema or neck rigidity.  Cardiovascular:     Rate and Rhythm: Normal rate and regular rhythm.     Heart sounds: No murmur.  Pulmonary:     Effort: Pulmonary effort is normal. No respiratory distress.     Breath sounds:  Wheezing (Patient has expiratory wheezing throughout all lung fields.) present. No rhonchi or rales.     Comments: SpO2 100% Abdominal:     General: There is no distension.     Palpations: Abdomen is soft.     Tenderness: There is no abdominal tenderness.  Lymphadenopathy:     Cervical: No cervical adenopathy.  Skin:    General: Skin is warm and dry.     Findings: No rash.  Neurological:     Mental Status: She is alert.  Psychiatric:        Behavior: Behavior normal.    ED Treatments / Results  Labs (all labs ordered are listed, but only abnormal results are displayed) Labs Reviewed - No data to display  EKG None  Radiology No results found.  Procedures Procedures (including critical care time)  Medications Ordered in ED Medications - No data to display   Initial Impression / Assessment and Plan / ED Course  I have reviewed the triage vital signs and the nursing notes.  Pertinent labs & imaging results that were available during my care of the patient were reviewed by me and considered in my medical decision making (see chart for details).   Patient presents to the emergency department requesting an inhaler for her wheezing/dyspnea which began last night.  Patient is nontoxic-appearing, no apparent distress, vitals WNL.  On exam she has diffuse expiratory wheezing but does not appear to be in respiratory distress.  She specifically requesting the "red inhaler" I asked her if she knew the name of the inhaler and she was unsure, I asked her if the inhaler provided her most recent emergency department visit was the one she was talking about and she confirmed.  Plan to order 4 puffs of albuterol with spacer with reassessment.  12:20: Respiratory therapy was at bedside instructed in use of inhaler and spacer, per respiratory therapy patient became angry stating that this was not the correct inhaler and stormed out of the emergency department with it stating that she would be back  later.  I reviewed patient's most recent emergency department visit from 01/12/2019 and confirmed that the inhaler that she received previously with a spacer was the same that was ordered today in terms of medication and dosing.  Unfortunately I was unable to formally discuss with the patient or reassess her breath sounds, I asked her to return but she continued to ambulate out of the emergency department in no distress.  Final Clinical Impressions(s) / ED Diagnoses   Final diagnoses:  Wheezing    ED Discharge Orders    None       Amaryllis Dyke, PA-C 03/23/19 1226    Gareth Morgan, MD 03/24/19 2109

## 2019-03-23 NOTE — ED Notes (Signed)
Per RT pt wanted a "red" inhaler the same one she received at prior visit.  Stated that she is leaving and walked out of room.

## 2019-03-23 NOTE — ED Notes (Signed)
Patient decided to leave after registering.

## 2019-03-31 ENCOUNTER — Encounter (HOSPITAL_COMMUNITY): Payer: Self-pay | Admitting: *Deleted

## 2019-03-31 ENCOUNTER — Ambulatory Visit (HOSPITAL_COMMUNITY)
Admission: EM | Admit: 2019-03-31 | Discharge: 2019-03-31 | Disposition: A | Discharge status: Home / Self Care | Payer: Medicaid Other | Attending: Emergency Medicine | Admitting: Emergency Medicine

## 2019-03-31 ENCOUNTER — Other Ambulatory Visit: Payer: Self-pay

## 2019-03-31 DIAGNOSIS — J45901 Unspecified asthma with (acute) exacerbation: Secondary | ICD-10-CM

## 2019-03-31 HISTORY — DX: Unspecified asthma, uncomplicated: J45.909

## 2019-03-31 MED ORDER — LEVOCETIRIZINE DIHYDROCHLORIDE 5 MG PO TABS: 5.0000 mg | ORAL_TABLET | Freq: Every evening | ORAL | 0 refills | Status: DC

## 2019-03-31 MED ORDER — ALBUTEROL SULFATE HFA 108 (90 BASE) MCG/ACT IN AERS: 1.0000 | INHALATION_SPRAY | Freq: Four times a day (QID) | RESPIRATORY_TRACT | 1 refills | Status: DC | PRN

## 2019-03-31 MED ORDER — DEXAMETHASONE 4 MG PO TABS: ORAL_TABLET | ORAL | 0 refills | Status: DC

## 2019-03-31 MED ORDER — MONTELUKAST SODIUM 10 MG PO TABS: 10.0000 mg | ORAL_TABLET | Freq: Every day | ORAL | 0 refills | Status: DC

## 2019-03-31 NOTE — Discharge Instructions (Addendum)
Take two puffs from your albuterol inhaler every 4 hours.  You may back off onto this to every 6 hours, then as needed.  Finish the steroids unless a doctor tells you to stop.  Go Immediately to the ER for fevers above 100.4, if you are still needing your inhaler every hour, if you get worse, or for any other concerns.   Below is a list of primary care practices who are taking new patients for you to follow-up with.  [facility] at [facility] [address] [place], [place] [address] [phone]  [place], [place] [address] [phone]  [facility] [phone] [place] [address]  [facility]: [place]  [phone]  [place] and [facility]: [place]   [phone]  [facility] Family Medicine: [address] [place]  [phone]  [place] primary care : [address] [place] [phone]  [facility] Primary Care: [address] [place] [phone]  [facility]: [place] [phone]  Dr. Blanchie Serve [place]  [phone]  Dr. Benito Mccreedy, [facility]. [place], [place] [address]  [phone]  Go to www.goodrx.com to look up your medications. This will give you a list of where you can find your prescriptions at the most affordable prices. Or ask the pharmacist what the cash price is, or if they have any other discount programs available to help make your medication more affordable. This can be less expensive than what you would pay with  insurance.

## 2019-03-31 NOTE — ED Provider Notes (Signed)
HPI  SUBJECTIVE:  Nichole Richardson is a 28 y.o. female who presents with dry cough, shortness of breath, wheezing, diffuse chest soreness 2 days ago.  She states that she needs an inhaler as she ran out of her Proventil 2 days ago.  She states that she is using it "a lot", more than once an hour.  She states that the "blue inhaler" works much better for her.  She states that she has 2 spacers at home.  No other chest pain.  She states her symptoms are identical to previous asthma exacerbations.  No respiratory fatigue, calf pain, swelling or hemoptysis, recent immobilization, trauma, surgery, exogenous estrogen.  No fevers, nasal congestion, exposure to COVID.  No recent steroids.  She has been taking an unknown allergy pill- possibly zyrtec-  and using her Proventil without improvement in her symptoms.  There are no aggravating or alleviating factors.  She has a past medical history of allergies, asthma, smokes marijuana only.  Denies cigarette use.  No history of cancer, DVT, PE, diabetes, hypertension.  LMP: Irregular.  She is in a same-sex relationship and denies the possibility of being pregnant.  PMD: None.  Of note, she has been seen in the ER 5 times this year for wheezing, shortness of breath and has been prescribed various inhalers, steriods.  She states that she moved here about 6 months ago, did not have asthma or allergies until she moved to [place].    Past Medical History:  Diagnosis Date  . Asthma   . Renal stones     History reviewed. No pertinent surgical history.  Family History  Problem Relation Age of Onset  . Healthy Mother   . Healthy Father     Social History   Tobacco Use  . Smoking status: Never Smoker  . Smokeless tobacco: Never Used  Substance Use Topics  . Alcohol use: Not Currently  . Drug use: Yes    Types: Marijuana    No current facility-administered medications for this encounter.   Current Outpatient Medications:  .  albuterol (VENTOLIN HFA)  108 (90 Base) MCG/ACT inhaler, Inhale 1-2 puffs into the lungs every 6 (six) hours as needed for wheezing or shortness of breath., Disp: 18 g, Rfl: 1 .  dexamethasone (DECADRON) 4 MG tablet, 4 tablets (16 mg) po at once on day 1 and 4 tabs (16 mg) po at once on day 2, Disp: 8 tablet, Rfl: 0 .  levocetirizine (XYZAL) 5 MG tablet, Take 1 tablet (5 mg total) by mouth every evening., Disp: 30 tablet, Rfl: 0 .  montelukast (SINGULAIR) 10 MG tablet, Take 1 tablet (10 mg total) by mouth at bedtime., Disp: 30 tablet, Rfl: 0  No Known Allergies   ROS  As noted in HPI.   Physical Exam  BP 115/75   Pulse 96   Temp 97.9 F (36.6 C) (Other (Comment))   Resp 18   LMP 02/21/2019 (Exact Date) Comment: pt is homosexual  SpO2 95%   Constitutional: Well developed, well nourished, no acute distress.  Speaking in full sentences. Eyes:  EOMI, conjunctiva normal bilaterally HENT: Normocephalic, atraumatic,mucus membranes moist Respiratory: Normal inspiratory effort, diffuse wheezing throughout.  Fair air movement.  No rales or rhonchi.  No chest wall tenderness Cardiovascular: Normal rate no murmurs rubs or gallops GI: nondistended skin: No rash, skin intact Musculoskeletal: Calves symmetric, nontender, no edema. Neurologic: Alert & oriented x 3, no focal neuro deficits Psychiatric: Speech and behavior appropriate   ED Course  Medications - No data to display  No orders of the defined types were placed in this encounter.   No results found for this or any previous visit (from the past 24 hour(s)). No results found.  ED Clinical Impression  1. Moderate asthma with exacerbation, unspecified whether persistent      ED Assessment/Plan  Patient is PERC negative.  Doubt DVT or PE.  Presentation most consistent with an asthma exacerbation.  Patient states that Ventolin is the inhaler that works best for her.  Confirmed this with her.  Will prescribe this with a refill. Advised her to always  use her spacers when she uses her inhaler.  We will start her on levocetirizine, discontinue the Zyrtec that she is on now, will start on Singulair.  2 days of dexamethasone.  Also primary care list for ongoing care.  Discussed  MDM, treatment plan, and plan for follow-up with patient. Discussed sn/sx that should prompt return to the ED. patient agrees with plan.   Meds ordered this encounter  Medications  . albuterol (VENTOLIN HFA) 108 (90 Base) MCG/ACT inhaler    Sig: Inhale 1-2 puffs into the lungs every 6 (six) hours as needed for wheezing or shortness of breath.    Dispense:  18 g    Refill:  1  . montelukast (SINGULAIR) 10 MG tablet    Sig: Take 1 tablet (10 mg total) by mouth at bedtime.    Dispense:  30 tablet    Refill:  0  . dexamethasone (DECADRON) 4 MG tablet    Sig: 4 tablets (16 mg) po at once on day 1 and 4 tabs (16 mg) po at once on day 2    Dispense:  8 tablet    Refill:  0  . levocetirizine (XYZAL) 5 MG tablet    Sig: Take 1 tablet (5 mg total) by mouth every evening.    Dispense:  30 tablet    Refill:  0    *This clinic note was created using Scientist, clinical (histocompatibility and immunogenetics). Therefore, there may be occasional mistakes despite careful proofreading.   ?    Domenick Gong, MD 04/01/19 [phone]

## 2019-03-31 NOTE — ED Triage Notes (Signed)
C/O asthma dx since Jan 2020 after moving to [facility]; states has been in hospital "7x" since; has been using inhalers - started Proventil 9 days ago.  States feels like only ProAir works for her; states ran out of inhaler 2 days ago. Dry, wheezy cough noted.

## 2019-04-17 ENCOUNTER — Telehealth: Payer: Self-pay

## 2019-04-17 NOTE — Telephone Encounter (Signed)

## 2019-04-18 ENCOUNTER — Ambulatory Visit: Payer: Medicaid Other

## 2019-04-18 NOTE — Progress Notes (Deleted)
   Subjective:    Patient ID: Nichole Richardson, female    DOB: [birth year], 28 y.o.   MRN: [id number]  New pt  Asthma Primary care      Review of Systems     Objective:   Physical Exam        Assessment & Plan:

## 2019-05-25 ENCOUNTER — Ambulatory Visit (HOSPITAL_COMMUNITY)
Admission: EM | Admit: 2019-05-25 | Discharge: 2019-05-25 | Disposition: A | Discharge status: Home / Self Care | Payer: Medicaid Other | Attending: Family Medicine | Admitting: Family Medicine

## 2019-05-25 ENCOUNTER — Other Ambulatory Visit: Payer: Self-pay

## 2019-05-25 ENCOUNTER — Encounter (HOSPITAL_COMMUNITY): Payer: Self-pay

## 2019-05-25 DIAGNOSIS — J4521 Mild intermittent asthma with (acute) exacerbation: Secondary | ICD-10-CM | POA: Diagnosis not present

## 2019-05-25 MED ORDER — LEVOCETIRIZINE DIHYDROCHLORIDE 5 MG PO TABS: 5.0000 mg | ORAL_TABLET | Freq: Every evening | ORAL | 0 refills | Status: DC

## 2019-05-25 MED ORDER — ALBUTEROL SULFATE HFA 108 (90 BASE) MCG/ACT IN AERS: 1.0000 | INHALATION_SPRAY | Freq: Four times a day (QID) | RESPIRATORY_TRACT | 0 refills | Status: DC | PRN

## 2019-05-25 MED ORDER — MONTELUKAST SODIUM 10 MG PO TABS: 10.0000 mg | ORAL_TABLET | Freq: Every day | ORAL | 0 refills | Status: DC

## 2019-05-25 NOTE — ED Triage Notes (Signed)
Pt states she needs a med refill for her asthma.

## 2019-05-27 NOTE — ED Provider Notes (Signed)
[facility] CARE CENTER   [id number] 05/25/19 Arrival Time: 1155  ASSESSMENT & PLAN:  1. Mild intermittent asthma with acute exacerbation     Sent to her pharmacy: Meds ordered this encounter  Medications  . albuterol (VENTOLIN HFA) 108 (90 Base) MCG/ACT inhaler    Sig: Inhale 1-2 puffs into the lungs every 6 (six) hours as needed for wheezing or shortness of breath.    Dispense:  18 g    Refill:  0  . levocetirizine (XYZAL) 5 MG tablet    Sig: Take 1 tablet (5 mg total) by mouth every evening.    Dispense:  30 tablet    Refill:  0  . montelukast (SINGULAIR) 10 MG tablet    Sig: Take 1 tablet (10 mg total) by mouth at bedtime.    Dispense:  30 tablet    Refill:  0   No indication for chest imaging. No respiratory distress at this time. Asthma precautions given. OTC symptom care as needed.  Recommend: Follow-up Information    [facility].   Specialty: Urgent Care Why: As needed. Contact information: [address] [place] [address] [phone]          Reviewed expectations re: course of current medical issues. Questions answered. Outlined signs and symptoms indicating need for more acute intervention. Patient verbalized understanding. After Visit Summary given.  SUBJECTIVE: History from: patient.  Nichole Richardson is a 28 y.o. female who presents with complaint of intermittent wheezing. Onset gradual, over the past 1-2 weeks. Triggers: questions cooler weather. Describes wheezing as mild to moderate when present. Fever: no. Overall normal PO intake without n/v. Sick contacts: no. Ambulatory without difficulty. No LE edema. Typically her asthma is well controlled. Inhaler use: "out of my medicines". OTC treatment: none reported.  Received flu shot this year: no.  Social History   Tobacco Use  Smoking Status Never Smoker  Smokeless Tobacco Never Used    ROS: As per HPI.   OBJECTIVE:  Vitals:   05/25/19 1231 05/25/19 1233  BP:  112/68  Pulse:  90  Resp:  16  Temp:  98.5 F (36.9 C)  TempSrc:  Oral  SpO2:  100%  Weight: 59 kg      General appearance: alert; NAD HEENT: [place]; AT; mild nasal congestion Neck: supple without LAD CV: RRR without murmer Lungs: unlabored respirations, mild bilateral expiratory wheezing; cough: absent; no significant respiratory distress Abd: soft; non-tender Ext: without edema Skin: warm and dry Psychological: alert and cooperative; normal mood and affect   No Known Allergies  Past Medical History:  Diagnosis Date  . Asthma   . Renal stones    Family History  Problem Relation Age of Onset  . Healthy Mother   . Healthy Father    Social History   Socioeconomic History  . Marital status: Single    Spouse name: Not on file  . Number of children: Not on file  . Years of education: Not on file  . Highest education level: Not on file  Occupational History  . Not on file  Social Needs  . Financial resource strain: Not on file  . Food insecurity    Worry: Not on file    Inability: Not on file  . Transportation needs    Medical: Not on file    Non-medical: Not on file  Tobacco Use  . Smoking status: Never Smoker  . Smokeless tobacco: Never Used  Substance and Sexual Activity  .  Alcohol use: Not Currently  . Drug use: Yes    Types: Marijuana  . Sexual activity: Yes    Comment: lesbian  Lifestyle  . Physical activity    Days per week: Not on file    Minutes per session: Not on file  . Stress: Not on file  Relationships  . Social Herbalist on phone: Not on file    Gets together: Not on file    Attends religious service: Not on file    Active member of club or organization: Not on file    Attends meetings of clubs or organizations: Not on file    Relationship status: Not on file  . Intimate partner violence    Fear of current or ex partner: Not on file    Emotionally abused: Not on file    Physically abused:  Not on file    Forced sexual activity: Not on file  Other Topics Concern  . Not on file  Social History Narrative  . Not on file            Vanessa Kick, MD 05/27/19 [phone]

## 2019-06-04 ENCOUNTER — Ambulatory Visit: Payer: Medicaid Other | Admitting: Family Medicine

## 2019-06-04 ENCOUNTER — Encounter: Payer: Self-pay | Admitting: Family Medicine

## 2019-06-04 ENCOUNTER — Other Ambulatory Visit: Payer: Self-pay

## 2019-06-04 VITALS — BP 114/60 | HR 110 | Wt 130.0 lb

## 2019-06-04 DIAGNOSIS — J4540 Moderate persistent asthma, uncomplicated: Secondary | ICD-10-CM

## 2019-06-04 DIAGNOSIS — Z0000 Encounter for general adult medical examination without abnormal findings: Secondary | ICD-10-CM | POA: Diagnosis not present

## 2019-06-04 DIAGNOSIS — Z23 Encounter for immunization: Secondary | ICD-10-CM

## 2019-06-04 DIAGNOSIS — J45909 Unspecified asthma, uncomplicated: Secondary | ICD-10-CM | POA: Insufficient documentation

## 2019-06-04 MED ORDER — MONTELUKAST SODIUM 10 MG PO TABS: 10.0000 mg | ORAL_TABLET | Freq: Every day | ORAL | 0 refills | Status: DC

## 2019-06-04 MED ORDER — LEVOCETIRIZINE DIHYDROCHLORIDE 5 MG PO TABS: 5.0000 mg | ORAL_TABLET | Freq: Every evening | ORAL | 0 refills | Status: DC

## 2019-06-04 MED ORDER — DULERA 100-5 MCG/ACT IN AERO: 2.0000 | INHALATION_SPRAY | Freq: Two times a day (BID) | RESPIRATORY_TRACT | 2 refills | Status: DC

## 2019-06-04 MED ORDER — ALBUTEROL SULFATE HFA 108 (90 BASE) MCG/ACT IN AERS: 1.0000 | INHALATION_SPRAY | Freq: Four times a day (QID) | RESPIRATORY_TRACT | 0 refills | Status: DC | PRN

## 2019-06-04 NOTE — Assessment & Plan Note (Signed)
Plan: -We will prescribe Dulera -Provided refills for albuterol, Singulair, Xyzal -Can consider PFTs in the future

## 2019-06-04 NOTE — Progress Notes (Signed)
   Subjective:    Patient ID: Nichole Richardson, female    DOB: [birth year], 28 y.o.   MRN: [id number]   CC: New Patient Encounter  HPI:  Patient is a very pleasant 28 year old female the presents today to establish care and to discuss her asthma.  Patient states that since moving to [place] in January she has had new onset allergies and asthma.  She has been prescribed a rescue inhaler with albuterol for this, however she is found that she has to use his rescue inhaler upwards of 2 or 3 times per day, every day during the changes of seasons.  He states that she is also used Singulair and Xyzal for her allergies but that she still has regular flares with her asthma.  She denies nocturnal symptoms at this time.   ROS: pertinent noted in the HPI   Pertinent PMH, PSH, FH, SoHx: Lives at home with girlfriend, daughter and dog.   Smoking status -smokes 2-3 or more blunts per day of marijuana  Objective:  BP 114/60   Pulse (!) 110   Wt 130 lb (59 kg)   LMP 05/24/2019 (Exact Date)   SpO2 98%   BMI 23.78 kg/m   Vitals and nursing note reviewed  General: NAD, pleasant, able to participate in exam Cardiac: RRR, S1 S2 present. normal heart sounds, no murmurs. Respiratory: End expiratory wheezing present, no respiratory distress Extremities: no edema or cyanosis. Skin: warm and dry, no rashes noted Neuro: alert, no obvious focal deficits Psych: Normal affect and mood   Assessment & Plan:    Asthma Plan: -We will prescribe Dulera -Provided refills for albuterol, Singulair, Xyzal -Can consider PFTs in the future  -Patient requested to postpone her flu shot until next year.  She understands that it is impossible to get the flu from the flu shot, however she has never received the flu shot before she does not wish to have it at this time.  Also discussed with patient importance of having the flu shot this year in particular with the COVID-19 pandemic in place as well as her history of  asthma.  Patient states she understands and wishes to hold off until next year. -Patient also wishes to get her Tdap shot today -We will provide Pap smear during next visit - Will provide HIV screening today - Provided refills for her other allergy/asthma medications  Lurline Del, [place] PGY-1

## 2019-06-04 NOTE — Patient Instructions (Addendum)
It was great to see you!   Our plans for today:  - We discussed your asthma today.  Due to your asthma symptoms I am prescribing a new asthma medication, Dulera as a controller medication.  You will use this medication twice per day, taking 2 puffs each time. -Plan to follow-up in about 3 months to check on your asthma symptoms, or sooner if needed -Feel free to make a follow-up appointment so that we can complete your Pap smear at your convenience. - We are also giving a TDaP shot (tetanus) and checking HIV as recommended for all adults.   Take care and seek immediate care sooner if you develop any concerns.   Dr. [facility]

## 2019-06-05 ENCOUNTER — Encounter: Payer: Self-pay | Admitting: Family Medicine

## 2019-06-05 LAB — HIV ANTIBODY (ROUTINE TESTING W REFLEX): HIV Screen 4th Generation wRfx: NONREACTIVE

## 2019-08-07 ENCOUNTER — Other Ambulatory Visit: Payer: Self-pay

## 2019-08-07 ENCOUNTER — Encounter (HOSPITAL_COMMUNITY): Payer: Self-pay | Admitting: Emergency Medicine

## 2019-08-07 ENCOUNTER — Ambulatory Visit (HOSPITAL_COMMUNITY)
Admission: EM | Admit: 2019-08-07 | Discharge: 2019-08-07 | Disposition: A | Discharge status: Home / Self Care | Payer: Medicaid Other | Attending: Family Medicine | Admitting: Family Medicine

## 2019-08-07 DIAGNOSIS — J4540 Moderate persistent asthma, uncomplicated: Secondary | ICD-10-CM

## 2019-08-07 MED ORDER — DULERA 100-5 MCG/ACT IN AERO: 2.0000 | INHALATION_SPRAY | Freq: Two times a day (BID) | RESPIRATORY_TRACT | 2 refills | Status: DC

## 2019-08-07 MED ORDER — ALBUTEROL SULFATE HFA 108 (90 BASE) MCG/ACT IN AERS: 1.0000 | INHALATION_SPRAY | Freq: Four times a day (QID) | RESPIRATORY_TRACT | 0 refills | Status: DC | PRN

## 2019-08-07 MED ORDER — MONTELUKAST SODIUM 10 MG PO TABS: 10.0000 mg | ORAL_TABLET | Freq: Every day | ORAL | 0 refills | Status: DC

## 2019-08-07 MED ORDER — LEVOCETIRIZINE DIHYDROCHLORIDE 5 MG PO TABS: 5.0000 mg | ORAL_TABLET | Freq: Every evening | ORAL | 0 refills | Status: DC

## 2019-08-07 NOTE — Discharge Instructions (Signed)
Refilled your medications Follow up with your doctor as needed

## 2019-08-07 NOTE — ED Triage Notes (Addendum)
Patient reports she is out of inhaler and needs a refill.  Patient reports wheezing for 3 days.  Patient has cough.  Denies any other cold symptoms  No pcp.  Comes here " to see the same guy"

## 2019-08-08 NOTE — ED Provider Notes (Signed)
[facility]    CSN: [id number] Arrival date & time: 08/07/19  8299      History   Chief Complaint Chief Complaint  Patient presents with  . Wheezing  . Medication Refill    HPI Nichole Richardson is a 29 y.o. female.   29 year old female past medical history of asthma.  She presents today for increased wheezing and coughing. Symptoms have been constant.  Symptoms are usually controlled by asthma inhalers that she is currently out of medication.  She has not had this medication in a few days.  Denies any shortness of breath.  Denies any fever, chills, body aches, night sweats nasal congestion, loss of taste or smell or diarrhea.  No recent sick contacts.  No calf pain or swelling.  No history of PE or DVT.  ROS per HPI      Past Medical History:  Diagnosis Date  . Asthma   . Renal stones     Patient Active Problem List   Diagnosis Date Noted  . Asthma 06/04/2019    History reviewed. No pertinent surgical history.  OB History   No obstetric history on file.      Home Medications    Prior to Admission medications   Medication Sig Start Date End Date Taking? Authorizing Provider  albuterol (VENTOLIN HFA) 108 (90 Base) MCG/ACT inhaler Inhale 1-2 puffs into the lungs every 6 (six) hours as needed for wheezing or shortness of breath. 08/07/19   Dahlia Byes A, NP  levocetirizine (XYZAL) 5 MG tablet Take 1 tablet (5 mg total) by mouth every evening. 08/07/19 09/06/19  Dahlia Byes A, NP  mometasone-formoterol (DULERA) 100-5 MCG/ACT AERO Inhale 2 puffs into the lungs 2 (two) times daily. 08/07/19   Xiana Carns, Gloris Manchester A, NP  montelukast (SINGULAIR) 10 MG tablet Take 1 tablet (10 mg total) by mouth at bedtime. 08/07/19   Dahlia Byes A, NP  Albuterol (PROVENTIL IN) Inhale into the lungs.  03/31/19  [provider]  Cetirizine HCl (ZYRTEC PO) Take by mouth.  03/31/19  [provider]    Family History Family History  Problem Relation Age of Onset  . Healthy Mother    . Healthy Father     Social History Social History   Tobacco Use  . Smoking status: Never Smoker  . Smokeless tobacco: Never Used  Substance Use Topics  . Alcohol use: Yes  . Drug use: Yes    Types: Marijuana     Allergies   Patient has no known allergies.   Review of Systems Review of Systems   Physical Exam Triage Vital Signs ED Triage Vitals  Enc Vitals Group     BP 08/07/19 0844 121/82     Pulse Rate 08/07/19 0844 92     Resp 08/07/19 0844 (!) 22     Temp 08/07/19 0844 98.5 F (36.9 C)     Temp Source 08/07/19 0844 Oral     SpO2 08/07/19 0844 100 %     Weight --      Height --      Head Circumference --      Peak Flow --      Pain Score 08/07/19 0839 0     Pain Loc --      Pain Edu? --      Excl. in GC? --    No data found.  Updated Vital Signs BP 121/82 (BP Location: Left Arm)   Pulse 92   Temp 98.5 F (36.9 C) (  Oral)   Resp (!) 22   LMP 07/29/2019   SpO2 100%   Visual Acuity Right Eye Distance:   Left Eye Distance:   Bilateral Distance:    Right Eye Near:   Left Eye Near:    Bilateral Near:     Physical Exam Vitals and nursing note reviewed.  Constitutional:      General: She is not in acute distress.    Appearance: Normal appearance. She is not ill-appearing, toxic-appearing or diaphoretic.  HENT:     Head: Normocephalic.     Nose: Nose normal.  Eyes:     Conjunctiva/sclera: Conjunctivae normal.  Pulmonary:     Effort: Pulmonary effort is normal. No respiratory distress.     Breath sounds: No stridor. Wheezing present. No rhonchi or rales.  Musculoskeletal:        General: Normal range of motion.     Cervical back: Normal range of motion.  Skin:    General: Skin is warm and dry.     Findings: No rash.  Neurological:     Mental Status: She is alert.  Psychiatric:        Mood and Affect: Mood normal.      UC Treatments / Results  Labs (all labs ordered are listed, but only abnormal results are displayed) Labs  Reviewed - No data to display  EKG   Radiology No results found.  Procedures Procedures (including critical care time)  Medications Ordered in UC Medications - No data to display  Initial Impression / Assessment and Plan / UC Course  I have reviewed the triage vital signs and the nursing notes.  Pertinent labs & imaging results that were available during my care of the patient were reviewed by me and considered in my medical decision making (see chart for details).     Moderate persistent asthma-wheezing and decreased breath sounds heard in all lung fields. We will refill her Dulera, albuterol and allergy medications as requested.  No concern for infectious process at this time. We will have her follow-up with her PCP as needed Final Clinical Impressions(s) / UC Diagnoses   Final diagnoses:  Moderate persistent asthma, unspecified whether complicated     Discharge Instructions     Refilled your medications Follow up with your doctor as needed     ED Prescriptions    Medication Sig Dispense Auth. Provider   albuterol (VENTOLIN HFA) 108 (90 Base) MCG/ACT inhaler Inhale 1-2 puffs into the lungs every 6 (six) hours as needed for wheezing or shortness of breath. 18 g Aubryanna Nesheim A, NP   levocetirizine (XYZAL) 5 MG tablet Take 1 tablet (5 mg total) by mouth every evening. 30 tablet Dozier Berkovich A, NP   montelukast (SINGULAIR) 10 MG tablet Take 1 tablet (10 mg total) by mouth at bedtime. 30 tablet Jadon Ressler A, NP   mometasone-formoterol (DULERA) 100-5 MCG/ACT AERO Inhale 2 puffs into the lungs 2 (two) times daily. 13 g Loura Halt A, NP     PDMP not reviewed this encounter.   Loura Halt A, NP 08/08/19 [phone]

## 2019-08-12 ENCOUNTER — Other Ambulatory Visit: Payer: Self-pay

## 2019-08-12 MED ORDER — LEVOCETIRIZINE DIHYDROCHLORIDE 5 MG PO TABS: 5.0000 mg | ORAL_TABLET | Freq: Every evening | ORAL | 0 refills | Status: DC

## 2019-08-12 MED ORDER — DULERA 100-5 MCG/ACT IN AERO: 2.0000 | INHALATION_SPRAY | Freq: Two times a day (BID) | RESPIRATORY_TRACT | 2 refills | Status: DC

## 2019-08-12 MED ORDER — MONTELUKAST SODIUM 10 MG PO TABS: 10.0000 mg | ORAL_TABLET | Freq: Every day | ORAL | 0 refills | Status: DC

## 2019-08-12 MED ORDER — ALBUTEROL SULFATE HFA 108 (90 BASE) MCG/ACT IN AERS: 1.0000 | INHALATION_SPRAY | Freq: Four times a day (QID) | RESPIRATORY_TRACT | 0 refills | Status: DC | PRN

## 2019-08-12 NOTE — Telephone Encounter (Signed)
Patient calls nurse line requesting refills on medications prescribed in urgent care. Per patient, medications were thrown away over the weekend.  To PCP  Please advise  Veronda Prude, RN

## 2019-08-24 ENCOUNTER — Encounter (HOSPITAL_COMMUNITY): Payer: Self-pay

## 2019-08-24 ENCOUNTER — Emergency Department (HOSPITAL_COMMUNITY)
Admission: EM | Admit: 2019-08-24 | Discharge: 2019-08-24 | Disposition: A | Discharge status: Home / Self Care | Payer: Medicaid Other | Attending: Emergency Medicine | Admitting: Emergency Medicine

## 2019-08-24 DIAGNOSIS — Z5321 Procedure and treatment not carried out due to patient leaving prior to being seen by health care provider: Secondary | ICD-10-CM | POA: Diagnosis not present

## 2019-08-24 DIAGNOSIS — R0602 Shortness of breath: Secondary | ICD-10-CM | POA: Diagnosis not present

## 2019-08-24 MED ORDER — ALBUTEROL SULFATE HFA 108 (90 BASE) MCG/ACT IN AERS
8.0000 | INHALATION_SPRAY | Freq: Once | RESPIRATORY_TRACT | Status: AC
  Administered 2019-08-24: 8 via RESPIRATORY_TRACT
  Filled 2019-08-24: qty 6.7

## 2019-08-24 NOTE — ED Notes (Signed)
Patient called for room with no answer.  

## 2019-08-24 NOTE — ED Notes (Signed)
Patient called again x2 with no answer

## 2019-08-24 NOTE — ED Triage Notes (Signed)
Pt reports that she has asthma and ran out of her inhaler today, audible wheezing.

## 2019-10-04 ENCOUNTER — Other Ambulatory Visit: Payer: Self-pay | Admitting: Family Medicine

## 2019-10-23 ENCOUNTER — Ambulatory Visit (INDEPENDENT_AMBULATORY_CARE_PROVIDER_SITE_OTHER): Payer: Medicaid Other | Admitting: Family Medicine

## 2019-10-23 ENCOUNTER — Encounter: Payer: Self-pay | Admitting: Family Medicine

## 2019-10-23 ENCOUNTER — Other Ambulatory Visit: Payer: Self-pay

## 2019-10-23 VITALS — BP 104/60 | HR 72 | Wt 116.0 lb

## 2019-10-23 DIAGNOSIS — Z309 Encounter for contraceptive management, unspecified: Secondary | ICD-10-CM

## 2019-10-23 DIAGNOSIS — Z30019 Encounter for initial prescription of contraceptives, unspecified: Secondary | ICD-10-CM | POA: Diagnosis not present

## 2019-10-23 DIAGNOSIS — J4540 Moderate persistent asthma, uncomplicated: Secondary | ICD-10-CM

## 2019-10-23 LAB — POCT URINE PREGNANCY: Preg Test, Ur: NEGATIVE

## 2019-10-23 MED ORDER — FLUTICASONE PROPIONATE 50 MCG/ACT NA SUSP: 1.0000 | Freq: Every day | NASAL | 2 refills | Status: AC

## 2019-10-23 MED ORDER — DULERA 100-5 MCG/ACT IN AERO: 2.0000 | INHALATION_SPRAY | Freq: Two times a day (BID) | RESPIRATORY_TRACT | 2 refills | Status: DC

## 2019-10-23 MED ORDER — LEVOCETIRIZINE DIHYDROCHLORIDE 5 MG PO TABS: ORAL_TABLET | ORAL | 2 refills | Status: DC

## 2019-10-23 MED ORDER — MEDROXYPROGESTERONE ACETATE 150 MG/ML IM SUSY
150.0000 mg | PREFILLED_SYRINGE | INTRAMUSCULAR | Status: AC
  Administered 2019-10-23: 150 mg via INTRAMUSCULAR

## 2019-10-23 MED ORDER — ALBUTEROL SULFATE HFA 108 (90 BASE) MCG/ACT IN AERS: 1.0000 | INHALATION_SPRAY | Freq: Four times a day (QID) | RESPIRATORY_TRACT | 2 refills | Status: DC | PRN

## 2019-10-23 MED ORDER — MONTELUKAST SODIUM 10 MG PO TABS: ORAL_TABLET | ORAL | 2 refills | Status: DC

## 2019-10-23 NOTE — Progress Notes (Addendum)
   SUBJECTIVE:   CHIEF COMPLAINT / HPI:   Asthma: Patient reports that she is much improved after getting on controller medications regularly. She previously had to have multiple ED visits and she is happy she no longer has to do that after coming to our office. She reports using her albuterol less than once per week and no nighttime cough.   Contraception Management: Patient here for depo shot. No concerns or issues at this time.   PERTINENT  PMH / PSH: asthma  OBJECTIVE:  BP 104/60   Pulse 72   Wt 116 lb (52.6 kg)   LMP 09/06/2019 (Exact Date)   SpO2 97%   BMI 21.22 kg/m   General: NAD, pleasant Neck: Supple, no LAD Respiratory:  normal work of breathing Neuro: CN II-XII grossly intact Psych: AOx3, appropriate affect  ASSESSMENT/PLAN:   Asthma Patient well controlled on dulera, singulair, and albuterol prn. Refilled medications. Also refilled allergy medication xyzal and flonase.   Contraception management Patient with negative U preg and LMP on 09/06/19 with no concern for pregnancy at this time. Patient given depo and will return q3 months.     [place] Mildred Tuccillo, DO PGY-3, [facility]

## 2019-10-23 NOTE — Patient Instructions (Signed)
Thank you for coming to see me today. It was a pleasure! Today we talked about:   You asthma and allergies. I am glad you are doing well! I have refilled your medications. Please continue dulera every day and your albuterol as needed. I have also sent flonase to try to use daily.   Please follow-up in 1 year or sooner as needed.  If you have any questions or concerns, please do not hesitate to call the office at [phone].  Take Care,   [place] Elizette Shek, DO

## 2019-10-26 DIAGNOSIS — Z309 Encounter for contraceptive management, unspecified: Secondary | ICD-10-CM | POA: Insufficient documentation

## 2019-10-26 NOTE — Assessment & Plan Note (Signed)
Patient with negative U preg and LMP on 09/06/19 with no concern for pregnancy at this time. Patient given depo and will return q3 months.

## 2019-10-26 NOTE — Assessment & Plan Note (Signed)
Patient well controlled on dulera, singulair, and albuterol prn. Refilled medications. Also refilled allergy medication xyzal and flonase.

## 2019-12-06 ENCOUNTER — Other Ambulatory Visit: Payer: Self-pay | Admitting: Family Medicine

## 2019-12-31 ENCOUNTER — Ambulatory Visit: Payer: Medicaid Other | Admitting: Student in an Organized Health Care Education/Training Program

## 2020-01-22 ENCOUNTER — Other Ambulatory Visit: Payer: Self-pay

## 2020-01-22 ENCOUNTER — Ambulatory Visit: Payer: Medicaid Other

## 2020-01-22 MED ORDER — DULERA 100-5 MCG/ACT IN AERO: 2.0000 | INHALATION_SPRAY | Freq: Two times a day (BID) | RESPIRATORY_TRACT | 2 refills | Status: DC

## 2020-01-22 MED ORDER — ALBUTEROL SULFATE HFA 108 (90 BASE) MCG/ACT IN AERS: INHALATION_SPRAY | RESPIRATORY_TRACT | 2 refills | Status: AC

## 2020-01-22 MED ORDER — LEVOCETIRIZINE DIHYDROCHLORIDE 5 MG PO TABS: ORAL_TABLET | ORAL | 2 refills | Status: AC

## 2020-01-22 MED ORDER — MONTELUKAST SODIUM 10 MG PO TABS: ORAL_TABLET | ORAL | 2 refills | Status: AC

## 2020-03-16 ENCOUNTER — Other Ambulatory Visit: Payer: Self-pay | Admitting: Family Medicine

## 2020-03-25 ENCOUNTER — Ambulatory Visit (INDEPENDENT_AMBULATORY_CARE_PROVIDER_SITE_OTHER): Payer: Medicaid Other | Admitting: Family Medicine

## 2020-03-25 ENCOUNTER — Other Ambulatory Visit: Payer: Self-pay

## 2020-03-25 DIAGNOSIS — B354 Tinea corporis: Secondary | ICD-10-CM

## 2020-03-25 MED ORDER — TERBINAFINE HCL 1 % EX CREA: 1.0000 "application " | TOPICAL_CREAM | Freq: Every day | CUTANEOUS | 0 refills | Status: AC

## 2020-03-25 NOTE — Patient Instructions (Signed)
Thank you for coming to see me today. It was a pleasure.    Apply ointment to affected area daily. Please follow-up with PCP if symptoms worses  If you have any questions or concerns, please do not hesitate to call the office at [phone].  Best,  Dana Allan, MD [facility]

## 2020-03-29 ENCOUNTER — Encounter: Payer: Self-pay | Admitting: Family Medicine

## 2020-03-29 DIAGNOSIS — B354 Tinea corporis: Secondary | ICD-10-CM | POA: Insufficient documentation

## 2020-03-29 NOTE — Assessment & Plan Note (Signed)
Itchy circular rash on inner thighs, likely secondary to fungal etiology -Terbinafine 1% cream BID x 7/7 -Follow up with PCP as needed

## 2020-03-29 NOTE — Progress Notes (Signed)
    SUBJECTIVE:   CHIEF COMPLAINT / HPI: rash on legs  Rash Patient reports left inner thigh rash that started after a visit to [place] in July.  She was staying in a hotel and reports swimming which she noticed rash not long after.  She endorses that since then it has gotten bigger and has spread to the opposite leg. She also reports that the rash is itchy only.  Denies any fevers, pain in the area or joint pain. Does not report any insect bites or changes in detergents, lotions, shampoos or body wash.  Has not been in contact with anyone with similar symtpoms  PERTINENT  PMH / PSH:  None  OBJECTIVE:   BP 114/62   Pulse (!) 106   Ht 5\' 2"  (1.575 m)   Wt 116 lb (52.6 kg)   LMP 01/13/2020   SpO2 99%   BMI 21.22 kg/m    General: Alert and oriented, no apparent distress   Derm: flat, circular area clear borders on upper inner thighs.  See media pics   ASSESSMENT/PLAN:   Ringworm of body Itchy circular rash on inner thighs, likely secondary to fungal etiology -Terbinafine 1% cream BID x 7/7 -Follow up with PCP as needed     9/7, MD [facility]

## 2021-02-23 IMAGING — DX PORTABLE CHEST - 1 VIEW
1 series · 1 of 1 positions shown · non-contrast
Comparison: 07/15/2018

CLINICAL DATA: Wheezing, shortness of breath

EXAM:
PORTABLE CHEST 1 VIEW

[chest]
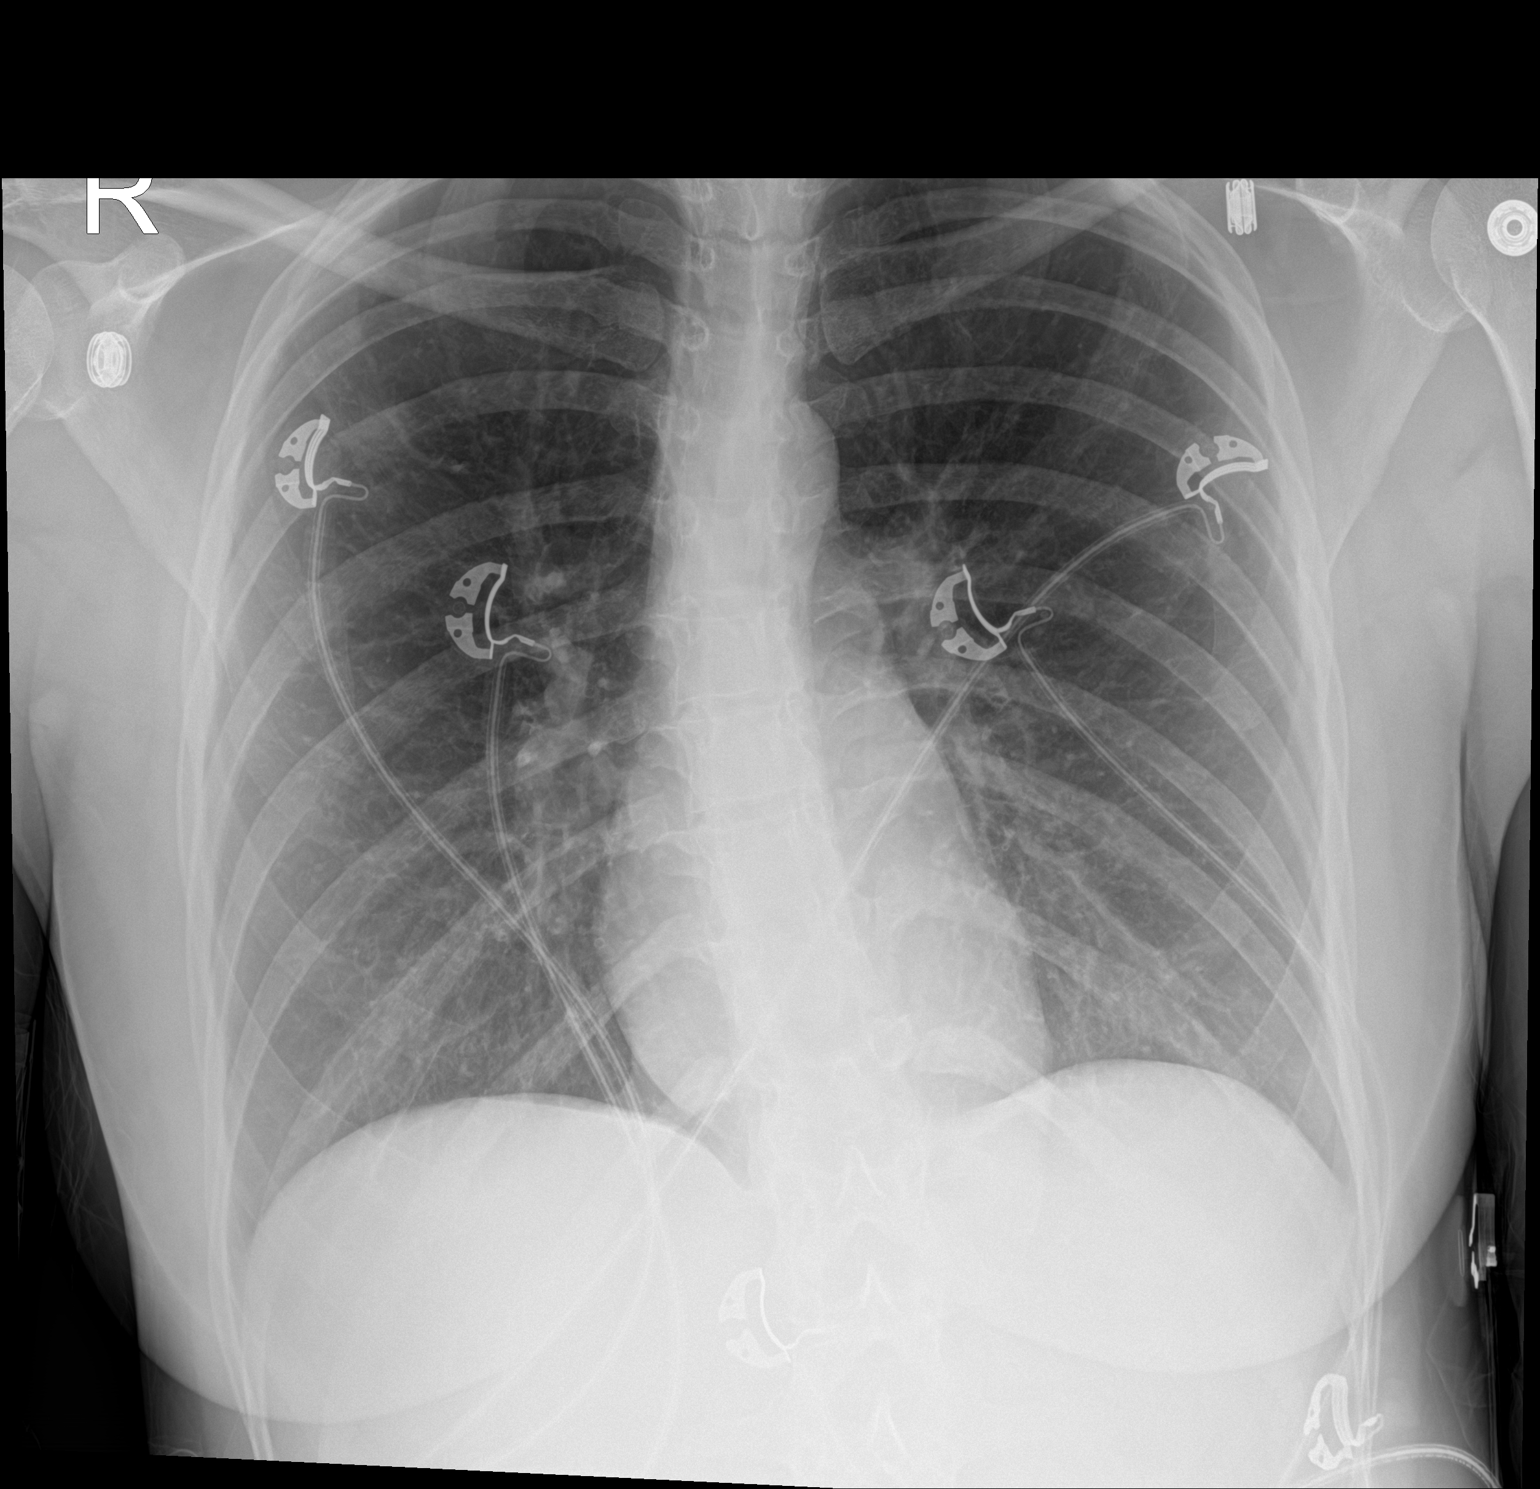

[1 of 1 positions shown; findings below may reference images not displayed]

FINDINGS: Heart and mediastinal contours are within normal limits. No focal
opacities or effusions. No acute bony abnormality.
IMPRESSION: No active disease.
